# Patient Record
Sex: Male | Born: 1980 | Race: White | Hispanic: No | Marital: Married | Smoking: Never smoker
Health system: Southern US, Community
[De-identification: ages and names within clinical notes are randomized; demographics above are authoritative.]

## PROBLEM LIST (undated history)

## (undated) DIAGNOSIS — F909 Attention-deficit hyperactivity disorder, unspecified type: Secondary | ICD-10-CM

---

## 2017-09-28 ENCOUNTER — Emergency Department (HOSPITAL_COMMUNITY)
Admission: EM | Admit: 2017-09-28 | Discharge: 2017-09-29 | Disposition: A | Discharge status: Home / Self Care | Payer: BLUE CROSS/BLUE SHIELD | Attending: Emergency Medicine | Admitting: Emergency Medicine

## 2017-09-28 ENCOUNTER — Other Ambulatory Visit: Payer: Self-pay

## 2017-09-28 ENCOUNTER — Emergency Department (HOSPITAL_COMMUNITY): Payer: BLUE CROSS/BLUE SHIELD

## 2017-09-28 ENCOUNTER — Encounter (HOSPITAL_COMMUNITY): Payer: Self-pay

## 2017-09-28 DIAGNOSIS — Y9389 Activity, other specified: Secondary | ICD-10-CM | POA: Insufficient documentation

## 2017-09-28 DIAGNOSIS — Z23 Encounter for immunization: Secondary | ICD-10-CM | POA: Diagnosis not present

## 2017-09-28 DIAGNOSIS — Y9289 Other specified places as the place of occurrence of the external cause: Secondary | ICD-10-CM | POA: Insufficient documentation

## 2017-09-28 DIAGNOSIS — S81011A Laceration without foreign body, right knee, initial encounter: Secondary | ICD-10-CM | POA: Diagnosis present

## 2017-09-28 DIAGNOSIS — W2209XA Striking against other stationary object, initial encounter: Secondary | ICD-10-CM | POA: Insufficient documentation

## 2017-09-28 DIAGNOSIS — Y999 Unspecified external cause status: Secondary | ICD-10-CM | POA: Insufficient documentation

## 2017-09-28 HISTORY — DX: Attention-deficit hyperactivity disorder, unspecified type: F90.9

## 2017-09-28 MED ORDER — ONDANSETRON HCL 4 MG/2ML IJ SOLN
4.0000 mg | Freq: Once | INTRAMUSCULAR | Status: AC
Start: 2017-09-28 — End: 2017-09-28
  Administered 2017-09-28: 4 mg via INTRAVENOUS
  Filled 2017-09-28: qty 2

## 2017-09-28 MED ORDER — FENTANYL CITRATE (PF) 100 MCG/2ML IJ SOLN
50.0000 ug | Freq: Once | INTRAMUSCULAR | Status: DC
  Filled 2017-09-28: qty 2

## 2017-09-28 MED ORDER — TETANUS-DIPHTH-ACELL PERTUSSIS 5-2.5-18.5 LF-MCG/0.5 IM SUSP
0.5000 mL | Freq: Once | INTRAMUSCULAR | Status: AC
  Administered 2017-09-28: 0.5 mL via INTRAMUSCULAR
  Filled 2017-09-28: qty 0.5

## 2017-09-28 MED ORDER — LIDOCAINE-EPINEPHRINE (PF) 2 %-1:200000 IJ SOLN
20.0000 mL | Freq: Once | INTRAMUSCULAR | Status: AC
  Administered 2017-09-29: 20 mL
  Filled 2017-09-28: qty 20

## 2017-09-28 MED ORDER — CEFAZOLIN SODIUM-DEXTROSE 2-4 GM/100ML-% IV SOLN
2.0000 g | Freq: Once | INTRAVENOUS | Status: AC
  Administered 2017-09-28: 2 g via INTRAVENOUS
  Filled 2017-09-28: qty 100
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
## 2017-09-28 NOTE — ED Notes (Signed)
Bed: WA17 Expected date:  Expected time:  Means of arrival:  Comments: EMS lac
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
## 2017-09-29 MED ORDER — IBUPROFEN 800 MG PO TABS: 800.0000 mg | ORAL_TABLET | Freq: Three times a day (TID) | ORAL | 0 refills | Status: AC | PRN

## 2017-09-29 MED ORDER — HYDROCODONE-ACETAMINOPHEN 5-325 MG PO TABS
2.0000 | ORAL_TABLET | Freq: Once | ORAL | Status: AC
  Administered 2017-09-29: 2 via ORAL
  Filled 2017-09-29: qty 2

## 2017-09-29 MED ORDER — ONDANSETRON 4 MG PO TBDP: 4.0000 mg | ORAL_TABLET | Freq: Four times a day (QID) | ORAL | 0 refills | Status: AC | PRN

## 2017-09-29 MED ORDER — CEPHALEXIN 500 MG PO CAPS: 500.0000 mg | ORAL_CAPSULE | Freq: Four times a day (QID) | ORAL | 0 refills | Status: AC

## 2017-09-29 MED ORDER — BACITRACIN ZINC 500 UNIT/GM EX OINT
TOPICAL_OINTMENT | CUTANEOUS | Status: AC
  Administered 2017-09-29: 1
  Filled 2017-09-29: qty 3.6

## 2017-09-29 MED ORDER — HYDROCODONE-ACETAMINOPHEN 5-325 MG PO TABS: 2.0000 | ORAL_TABLET | Freq: Four times a day (QID) | ORAL | 0 refills | Status: AC | PRN

## 2017-09-29 NOTE — Discharge Instructions (Signed)
Please keep your leg and knee immobilizer except when bathing.  You may alternate Tylenol 1000 mg every 6 hours as needed for pain and Ibuprofen 800 mg every 8 hours as needed for pain.  Please take Ibuprofen with food.  If these medications are not strong enough for pain control you may use hydrocodone.  Please note that this pain prescription has Tylenol in it.  You should not take more than 4000 mg of Tylenol a day.  Each tablet of Norco has 325 mg of Tylenol (also known as acetaminophen).

## 2017-09-29 NOTE — ED Notes (Signed)
Pt refused crutches.
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
